# Patient Record
Sex: Female | Born: 1970 | Hispanic: Yes | Marital: Married
Health system: Southern US, Community
[De-identification: ages and names within clinical notes are randomized; demographics above are authoritative.]

## PROBLEM LIST (undated history)

## (undated) DIAGNOSIS — I10 Essential (primary) hypertension: Secondary | ICD-10-CM

---

## 2006-07-30 ENCOUNTER — Emergency Department: Payer: Self-pay

## 2006-08-10 ENCOUNTER — Emergency Department: Payer: Self-pay | Admitting: Emergency Medicine

## 2006-08-14 ENCOUNTER — Emergency Department: Payer: Self-pay | Admitting: Emergency Medicine

## 2016-01-15 ENCOUNTER — Encounter: Payer: Self-pay | Admitting: Emergency Medicine

## 2016-01-15 ENCOUNTER — Emergency Department
Admission: EM | Admit: 2016-01-15 | Discharge: 2016-01-16 | Disposition: A | Discharge status: Home / Self Care | Payer: Self-pay | Attending: Emergency Medicine | Admitting: Emergency Medicine

## 2016-01-15 DIAGNOSIS — R1110 Vomiting, unspecified: Secondary | ICD-10-CM

## 2016-01-15 DIAGNOSIS — R42 Dizziness and giddiness: Secondary | ICD-10-CM | POA: Insufficient documentation

## 2016-01-15 DIAGNOSIS — N390 Urinary tract infection, site not specified: Secondary | ICD-10-CM | POA: Insufficient documentation

## 2016-01-15 DIAGNOSIS — R109 Unspecified abdominal pain: Secondary | ICD-10-CM | POA: Insufficient documentation

## 2016-01-15 DIAGNOSIS — R112 Nausea with vomiting, unspecified: Secondary | ICD-10-CM

## 2016-01-15 DIAGNOSIS — I10 Essential (primary) hypertension: Secondary | ICD-10-CM | POA: Insufficient documentation

## 2016-01-15 HISTORY — DX: Essential (primary) hypertension: I10

## 2016-01-15 LAB — CBC
HEMATOCRIT: 39.7 % (ref 35.0–47.0)
HEMOGLOBIN: 13.7 g/dL (ref 12.0–16.0)
MCH: 30.2 pg (ref 26.0–34.0)
MCHC: 34.5 g/dL (ref 32.0–36.0)
MCV: 87.7 fL (ref 80.0–100.0)
Platelets: 232 10*3/uL (ref 150–440)
RBC: 4.53 MIL/uL (ref 3.80–5.20)
RDW: 12.6 % (ref 11.5–14.5)
WBC: 6.6 10*3/uL (ref 3.6–11.0)

## 2016-01-15 LAB — POCT PREGNANCY, URINE: PREG TEST UR: NEGATIVE

## 2016-01-15 LAB — URINALYSIS COMPLETE WITH MICROSCOPIC (ARMC ONLY)
Bilirubin Urine: NEGATIVE
GLUCOSE, UA: NEGATIVE mg/dL
Ketones, ur: NEGATIVE mg/dL
Leukocytes, UA: NEGATIVE
Nitrite: POSITIVE — AB
PROTEIN: NEGATIVE mg/dL
SPECIFIC GRAVITY, URINE: 1.015 (ref 1.005–1.030)
pH: 6 (ref 5.0–8.0)

## 2016-01-15 LAB — BASIC METABOLIC PANEL
ANION GAP: 5 (ref 5–15)
BUN: 13 mg/dL (ref 6–20)
CHLORIDE: 104 mmol/L (ref 101–111)
CO2: 28 mmol/L (ref 22–32)
Calcium: 8.9 mg/dL (ref 8.9–10.3)
Creatinine, Ser: 0.79 mg/dL (ref 0.44–1.00)
GFR calc Af Amer: >60 mL/min (ref >60)
GFR calc non Af Amer: >60 mL/min (ref >60)
GLUCOSE: 98 mg/dL (ref 65–99)
POTASSIUM: 3.4 mmol/L — AB (ref 3.5–5.1)
Sodium: 137 mmol/L (ref 135–145)

## 2016-01-15 LAB — HEPATIC FUNCTION PANEL
ALT: 27 U/L (ref 14–54)
AST: 26 U/L (ref 15–41)
Albumin: 3.6 g/dL (ref 3.5–5.0)
Alkaline Phosphatase: 52 U/L (ref 38–126)
BILIRUBIN DIRECT: 0.1 mg/dL (ref 0.1–0.5)
BILIRUBIN INDIRECT: 0.7 mg/dL (ref 0.3–0.9)
TOTAL PROTEIN: 7.2 g/dL (ref 6.5–8.1)
Total Bilirubin: 0.8 mg/dL (ref 0.3–1.2)

## 2016-01-15 LAB — TROPONIN I: Troponin I: <0.03 ng/mL (ref <0.03)

## 2016-01-15 LAB — GLUCOSE, CAPILLARY: GLUCOSE-CAPILLARY: 93 mg/dL (ref 65–99)

## 2016-01-15 LAB — MAGNESIUM: MAGNESIUM: 1.7 mg/dL (ref 1.7–2.4)

## 2016-01-15 LAB — LIPASE, BLOOD: LIPASE: 31 U/L (ref 11–51)

## 2016-01-15 NOTE — ED Notes (Signed)
Pt reports she's been menstruation for since beginning of the month.

## 2016-01-15 NOTE — ED Notes (Signed)
Pt c/o dizzness/lightheadedness, dark black stools, diarrhea, N/V, and intermittent left arm numbness beginning Monday

## 2016-01-15 NOTE — ED Triage Notes (Signed)
Pt ambulatory to triage with no difficulty. Pt speaks Spanish but spoke enough english to state she wants her daughter to interpret for her. Explained to patient that we would get an interpreter if she did not understand anything. Pt reports started this past Monday with dizziness, weakness, n/v, diarrhea that has been getting worse over the week. Pt states now having numbness to her left arm. Pt is noted to be hypertensive during triage but denies hx of the same. Daughter does report pt has " some type of problem with an artery in her heart and has been told she may have had a stroke in the past".

## 2016-01-16 ENCOUNTER — Emergency Department: Payer: Self-pay

## 2016-01-16 MED ORDER — SODIUM CHLORIDE 0.9 % IV BOLUS (SEPSIS): 2000.0000 mL | Freq: Once | INTRAVENOUS | Status: DC

## 2016-01-16 MED ORDER — IOPAMIDOL (ISOVUE-300) INJECTION 61%
100.0000 mL | Freq: Once | INTRAVENOUS | Status: AC | PRN
  Administered 2016-01-16: 100 mL via INTRAVENOUS

## 2016-01-16 MED ORDER — SODIUM CHLORIDE 0.9 % IV BOLUS (SEPSIS)
1000.0000 mL | Freq: Once | INTRAVENOUS | Status: AC
  Administered 2016-01-16: 1000 mL via INTRAVENOUS

## 2016-01-16 MED ORDER — ONDANSETRON HCL 4 MG/2ML IJ SOLN
4.0000 mg | Freq: Once | INTRAMUSCULAR | Status: AC
  Administered 2016-01-16: 4 mg via INTRAVENOUS
  Filled 2016-01-16: qty 2

## 2016-01-16 MED ORDER — ONDANSETRON 4 MG PO TBDP: 4.0000 mg | ORAL_TABLET | Freq: Three times a day (TID) | ORAL | 0 refills | Status: AC | PRN

## 2016-01-16 MED ORDER — CEPHALEXIN 500 MG PO CAPS: 500.0000 mg | ORAL_CAPSULE | Freq: Two times a day (BID) | ORAL | 0 refills | Status: AC

## 2016-01-16 MED ORDER — IOPAMIDOL (ISOVUE-300) INJECTION 61%
30.0000 mL | Freq: Once | INTRAVENOUS | Status: AC | PRN
  Administered 2016-01-16: 30 mL via ORAL

## 2016-01-16 NOTE — ED Notes (Signed)
MD Brown at bedside.

## 2016-01-16 NOTE — ED Notes (Signed)
Reviewed d/c instructions, follow-up care and prescriptions with pt. Pt verbalized understanding 

## 2016-01-16 NOTE — ED Provider Notes (Signed)
[facility] Emergency Department Provider Note   First MD Initiated Contact with Patient 01/15/16 2221     (approximate)  I have reviewed the triage vital signs and the nursing notes.   HISTORY  Chief Complaint Dizziness; Near Syncope; Nausea; and Weakness   HPI Mia Moore is a 45 y.o. female presents with 8 out of 10 abdominal pain associated with vomiting 4 days. Patient states that she becomes very dizzy with positional change. Patient denies any diarrhea or fever. Patient denies any urinary symptoms.   Past medical history Hypertension There are no active problems to display for this patient.   Past surgical history None  Prior to Admission medications   Not on File    Allergies No known drug allergies No family history on file.  Social History Social History  Substance Use Topics  . Smoking status: Not on file  . Smokeless tobacco: Not on file  . Alcohol use Not on file    Review of Systems Constitutional: No fever/chills Eyes: No visual changes. ENT: No sore throat. Cardiovascular: Denies chest pain. Respiratory: Denies shortness of breath. Gastrointestinal: No abdominal pain.  No nausea, no vomiting.  No diarrhea.  No constipation. Genitourinary: Negative for dysuria. Musculoskeletal: Negative for back pain. Skin: Negative for rash. Neurological: Negative for headaches, focal weakness or numbness.  10-point ROS otherwise negative.  ____________________________________________   PHYSICAL EXAM:  VITAL SIGNS: ED Triage Vitals  Enc Vitals Group     BP 01/15/16 2126 (!) 172/70     Pulse Rate 01/15/16 2126 65     Resp 01/15/16 2126 18     Temp 01/15/16 2126 98.3 F (36.8 C)     Temp Source 01/15/16 2126 Oral     SpO2 01/15/16 2126 100 %     Weight 01/15/16 2126 180 lb (81.6 kg)     Height 01/15/16 2126 5\' 4"  (1.626 m)     Head Circumference --      Peak Flow --      Pain Score 01/15/16 2127 0     Pain  Loc --      Pain Edu? --      Excl. in GC? --     Constitutional: Alert and oriented. Well appearing and in no acute distress. Eyes: Conjunctivae are normal. PERRL. EOMI. Head: Atraumatic. Mouth/Throat: Mucous membranes are moist.  Oropharynx non-erythematous. Neck: No stridor.  No meningeal signs.  Cardiovascular: Normal rate, regular rhythm. Good peripheral circulation. Grossly normal heart sounds. Respiratory: Normal respiratory effort.  No retractions. Lungs CTAB. Gastrointestinal: Soft and nontender. No distention.  Musculoskeletal: No lower extremity tenderness nor edema. No gross deformities of extremities. Neurologic:  Normal speech and language. No gross focal neurologic deficits are appreciated.  Skin:  Skin is warm, dry and intact. No rash noted. Psychiatric: Mood and affect are normal. Speech and behavior are normal.  ____________________________________________   LABS (all labs ordered are listed, but only abnormal results are displayed)  Labs Reviewed  BASIC METABOLIC PANEL - Abnormal; Notable for the following:       Result Value   Potassium 3.4 (*)    All other components within normal limits  URINALYSIS COMPLETEWITH MICROSCOPIC ([facility] ONLY) - Abnormal; Notable for the following:    Color, Urine YELLOW (*)    APPearance HAZY (*)    Hgb urine dipstick 2+ (*)    Nitrite POSITIVE (*)    Bacteria, UA FEW (*)    Squamous Epithelial / LPF 0-5 (*)  All other components within normal limits  CBC  GLUCOSE, CAPILLARY  HEPATIC FUNCTION PANEL  TROPONIN I  LIPASE, BLOOD  MAGNESIUM  CBG MONITORING, ED  POC URINE PREG, ED  POCT PREGNANCY, URINE   ____________________________________________  EKG  ED ECG REPORT I, [place] N Pamala Hayman, the attending physician, personally viewed and interpreted this ECG.   Date: 01/16/2016  EKG Time: 9:26 PM  Rate: 65  Rhythm: Normal sinus rhythm  Axis: Normal  Intervals: Normal  ST&T Change:  None  ____________________________________________  RADIOLOGY I, [place] N Jayde Daffin, personally viewed and evaluated these images (plain radiographs) as part of my medical decision making, as well as reviewing the written report by the radiologist.  Ct Head Wo Contrast  Result Date: 01/16/2016 CLINICAL DATA:  Dizziness and lightheadedness. Left arm numbness. Hypertension. EXAM: CT HEAD WITHOUT CONTRAST TECHNIQUE: Contiguous axial images were obtained from the base of the skull through the vertex without intravenous contrast. COMPARISON:  None. FINDINGS: Brain: No evidence of acute infarction, hemorrhage, hydrocephalus, extra-axial collection or mass lesion/mass effect. Vascular: No hyperdense vessel or unexpected calcification. Skull: Normal. Negative for fracture or focal lesion. Sinuses/Orbits: No acute finding. Other: None. IMPRESSION: No acute intracranial abnormalities. Electronically Signed   By: Burman NievesWilliam  Stevens M.D.   On: 01/16/2016 00:39     Procedures      INITIAL IMPRESSION / ASSESSMENT AND PLAN / ED COURSE  Pertinent labs & imaging results that were available during my care of the patient were reviewed by me and considered in my medical decision making (see chart for details).     Clinical Course    ____________________________________________  FINAL CLINICAL IMPRESSION(S) / ED DIAGNOSES  Final diagnoses:  Vomiting  Non-intractable vomiting with nausea, vomiting of unspecified type  UTI (lower urinary tract infection)     MEDICATIONS GIVEN DURING THIS VISIT:  Medications  sodium chloride 0.9 % bolus 2,000 mL (not administered)  ondansetron (ZOFRAN) injection 4 mg (not administered)  sodium chloride 0.9 % bolus 1,000 mL (1,000 mLs Intravenous New Bag/Given 01/16/16 0046)     NEW OUTPATIENT MEDICATIONS STARTED DURING THIS VISIT:  New Prescriptions   No medications on file    Modified Medications   No medications on file    Discontinued Medications    No medications on file     Note:  This document was prepared using Dragon voice recognition software and may include unintentional dictation errors.    Darci Currentandolph N [employer], MD 01/16/16 [phone]

## 2016-01-16 NOTE — ED Notes (Signed)
Patient transported to CT 

## 2019-03-18 ENCOUNTER — Other Ambulatory Visit: Payer: Self-pay

## 2019-03-18 DIAGNOSIS — Z0000 Encounter for general adult medical examination without abnormal findings: Secondary | ICD-10-CM

## 2019-03-18 NOTE — Progress Notes (Signed)
Screened for [facility] eligibility. Will request pap results from [facility].  Patient sent for  Baseline mammogram on 03/20/2019 at 3:00 at [facility].

## 2019-03-20 ENCOUNTER — Ambulatory Visit
Admission: RE | Admit: 2019-03-20 | Discharge: 2019-03-20 | Disposition: A | Discharge status: Home / Self Care | Payer: Self-pay | Source: Ambulatory Visit | Attending: Oncology | Admitting: Oncology

## 2019-03-20 ENCOUNTER — Ambulatory Visit: Payer: Self-pay | Attending: Oncology

## 2019-03-20 DIAGNOSIS — Z0000 Encounter for general adult medical examination without abnormal findings: Secondary | ICD-10-CM | POA: Insufficient documentation

## 2019-05-01 NOTE — Progress Notes (Signed)
Letter mailed from [facility] to notify of normal mammogram results.  Patient to return in one year for annual screening.  Copy to [facility]. 

## 2020-07-26 IMAGING — MG DIGITAL SCREENING BILAT W/ TOMO W/ CAD
6 of 10 series · 6 of 30 positions shown · non-contrast
Comparison: None.

CLINICAL DATA: Screening. Baseline.

EXAM:
DIGITAL SCREENING BILATERAL MAMMOGRAM WITH TOMO AND CAD

[R MLO synth-2D (1 of 2)]
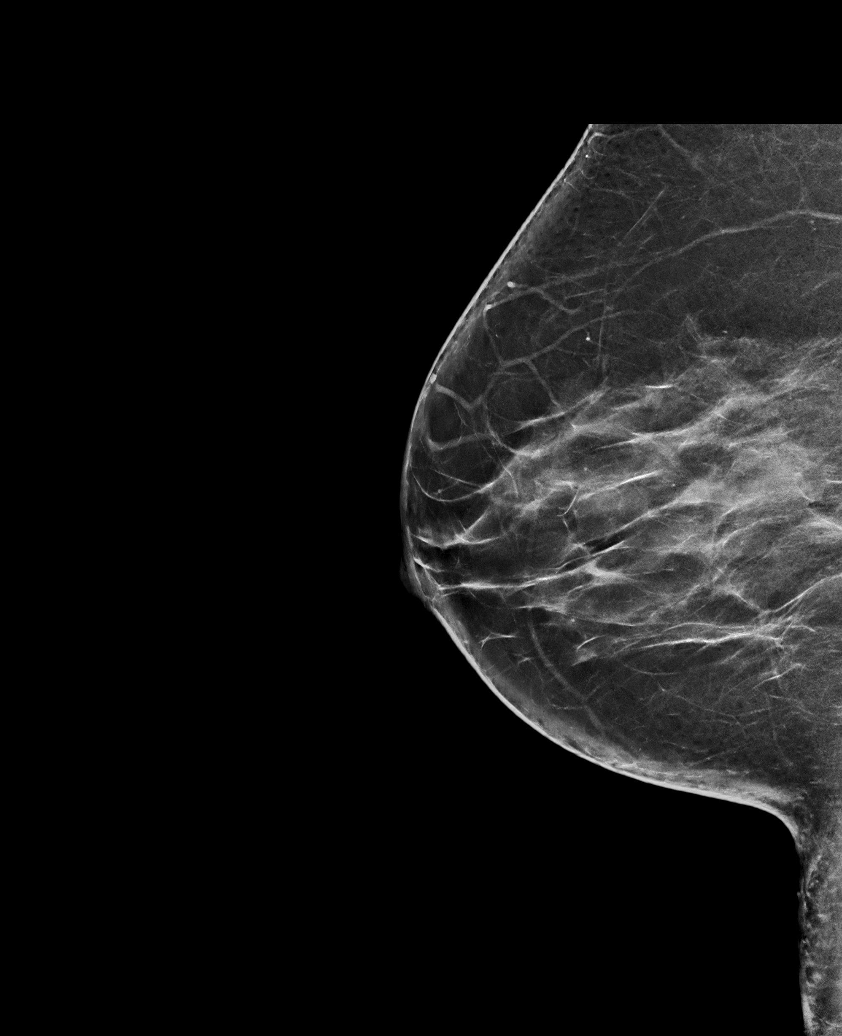

[L CC synth-2D]
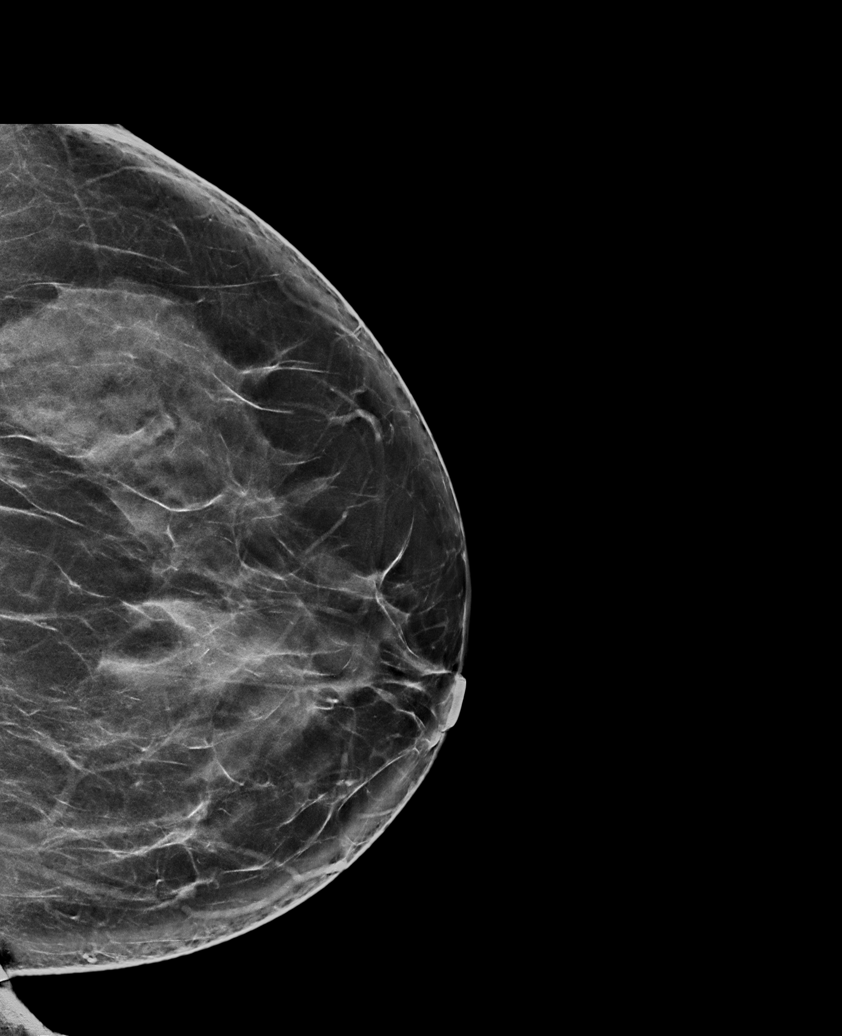

[R CC synth-2D]
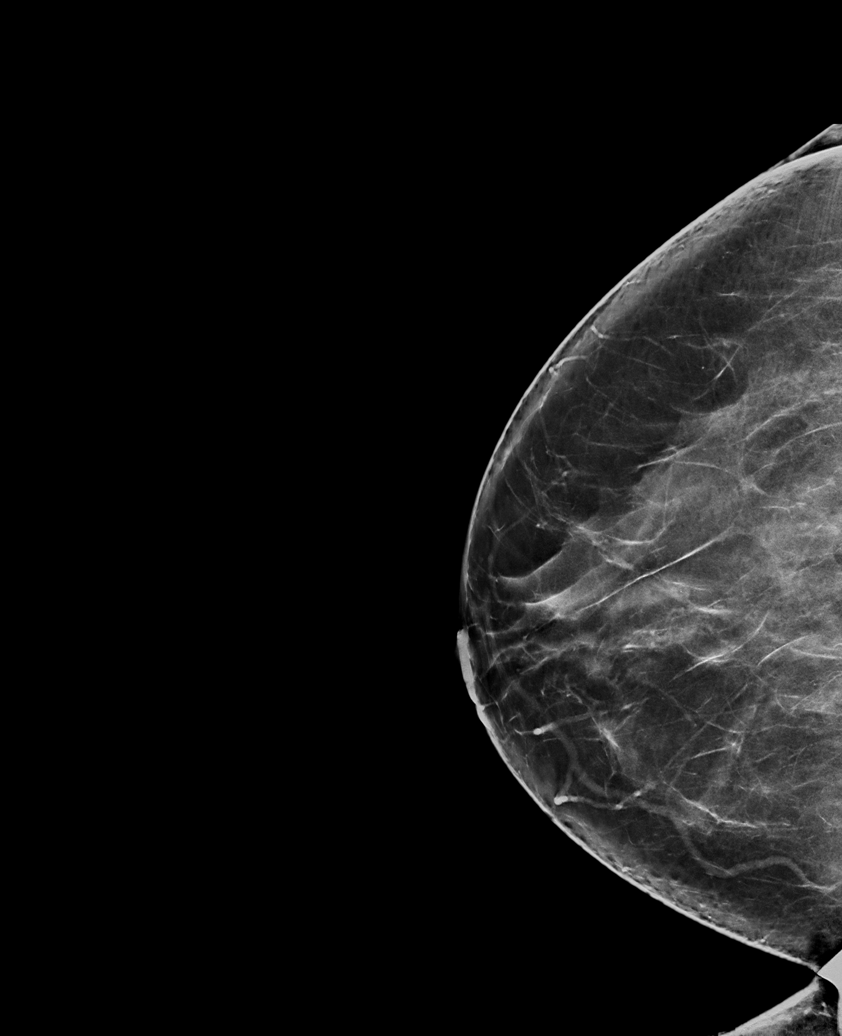

[L MLO synth-2D]
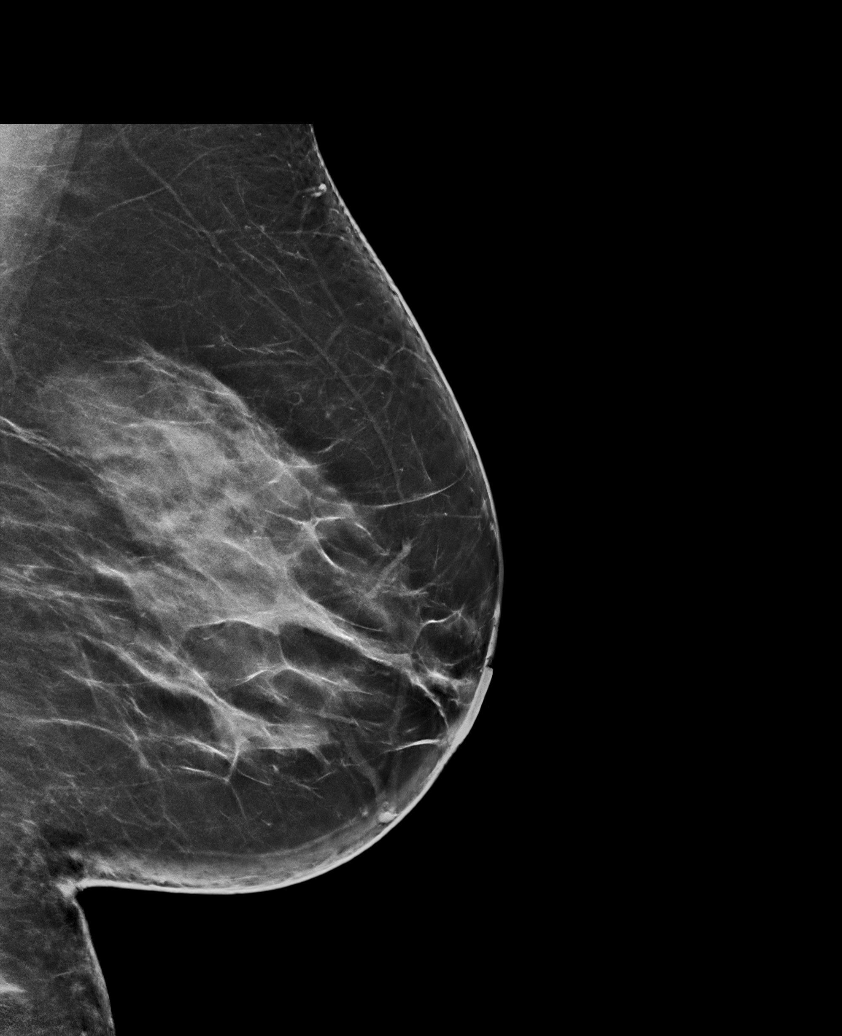

[R MLO synth-2D (2 of 2)]
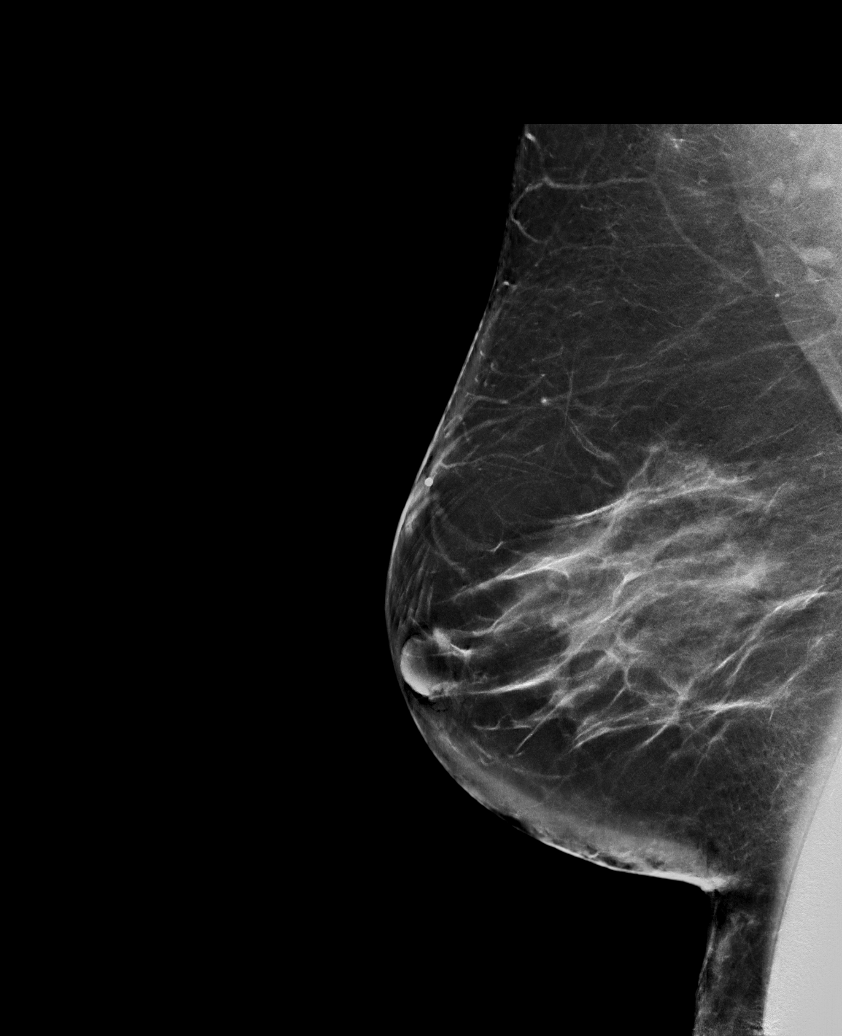

[L CC tomo · tomo slice 43/86.0]
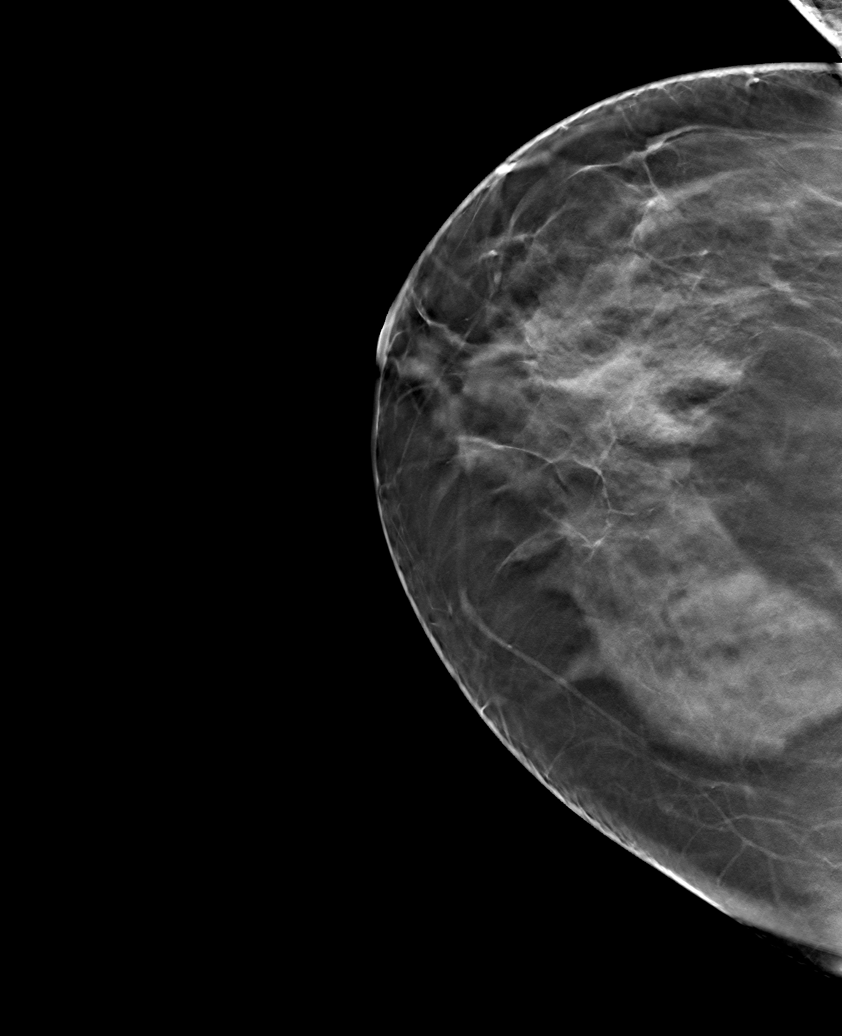

[6 of 30 positions shown; findings below may reference images not displayed]

ACR Breast Density Category c: The breast tissue is heterogeneously
dense, which may obscure small masses
FINDINGS: There are no findings suspicious for malignancy. Images were
processed with CAD.
IMPRESSION: No mammographic evidence of malignancy. A result letter of this
screening mammogram will be mailed directly to the patient.

RECOMMENDATION:
Screening mammogram in one year. (Code:EN-2-3B0)

BI-RADS CATEGORY  1: Negative.
